# Patient Record
Sex: Female | Born: 1955
Health system: Southern US, Community
[De-identification: ages and names within clinical notes are randomized; demographics above are authoritative.]

## PROBLEM LIST (undated history)

## (undated) DIAGNOSIS — F99 Mental disorder, not otherwise specified: Secondary | ICD-10-CM

## (undated) DIAGNOSIS — L309 Dermatitis, unspecified: Secondary | ICD-10-CM

## (undated) DIAGNOSIS — J45909 Unspecified asthma, uncomplicated: Secondary | ICD-10-CM

## (undated) DIAGNOSIS — N898 Other specified noninflammatory disorders of vagina: Secondary | ICD-10-CM

## (undated) DIAGNOSIS — B379 Candidiasis, unspecified: Secondary | ICD-10-CM

## (undated) DIAGNOSIS — T1490XA Injury, unspecified, initial encounter: Secondary | ICD-10-CM

## (undated) HISTORY — PX: APPENDECTOMY: SHX54

## (undated) HISTORY — PX: KNEE SURGERY: SHX244

## (undated) HISTORY — DX: Other specified noninflammatory disorders of vagina: N89.8

## (undated) HISTORY — DX: Dermatitis, unspecified: L30.9

## (undated) HISTORY — PX: GALLBLADDER SURGERY: SHX652

## (undated) HISTORY — DX: Mental disorder, not otherwise specified: F99

## (undated) HISTORY — PX: ABDOMINAL SURGERY: SHX537

## (undated) HISTORY — PX: FOOT SURGERY: SHX648

## (undated) HISTORY — DX: Unspecified asthma, uncomplicated: J45.909

## (undated) HISTORY — DX: Candidiasis, unspecified: B37.9

## (undated) HISTORY — DX: Injury, unspecified, initial encounter: T14.90XA

---

## 2004-11-23 ENCOUNTER — Ambulatory Visit: Payer: Self-pay | Admitting: Internal Medicine

## 2004-12-02 ENCOUNTER — Ambulatory Visit: Payer: Self-pay | Admitting: Internal Medicine

## 2005-05-27 ENCOUNTER — Emergency Department: Payer: Self-pay | Admitting: Emergency Medicine

## 2006-01-18 ENCOUNTER — Ambulatory Visit: Payer: Self-pay | Admitting: Internal Medicine

## 2006-01-29 ENCOUNTER — Emergency Department: Payer: Self-pay | Admitting: Internal Medicine

## 2006-08-23 ENCOUNTER — Ambulatory Visit: Payer: Self-pay | Admitting: Rheumatology

## 2008-01-28 ENCOUNTER — Ambulatory Visit: Payer: Self-pay | Admitting: Internal Medicine

## 2009-01-29 ENCOUNTER — Ambulatory Visit: Payer: Self-pay | Admitting: Internal Medicine

## 2009-04-07 ENCOUNTER — Ambulatory Visit: Payer: Self-pay | Admitting: Gastroenterology

## 2009-11-27 ENCOUNTER — Ambulatory Visit: Payer: Self-pay | Admitting: Rheumatology

## 2010-03-24 ENCOUNTER — Ambulatory Visit: Payer: Self-pay | Admitting: Internal Medicine

## 2010-07-01 ENCOUNTER — Ambulatory Visit: Payer: Self-pay | Admitting: Orthopedic Surgery

## 2011-04-07 ENCOUNTER — Ambulatory Visit: Payer: Self-pay | Admitting: Internal Medicine

## 2012-05-23 ENCOUNTER — Ambulatory Visit: Payer: Self-pay | Admitting: Internal Medicine

## 2013-05-24 ENCOUNTER — Ambulatory Visit: Payer: Self-pay | Admitting: Internal Medicine

## 2013-12-12 ENCOUNTER — Other Ambulatory Visit: Payer: Self-pay | Admitting: Adult Health

## 2013-12-13 ENCOUNTER — Encounter: Payer: Self-pay | Admitting: *Deleted

## 2013-12-13 ENCOUNTER — Other Ambulatory Visit: Payer: Self-pay | Admitting: Adult Health

## 2013-12-21 DIAGNOSIS — T1490XA Injury, unspecified, initial encounter: Secondary | ICD-10-CM

## 2013-12-21 HISTORY — DX: Injury, unspecified, initial encounter: T14.90XA

## 2014-01-10 ENCOUNTER — Other Ambulatory Visit: Payer: Self-pay | Admitting: Adult Health

## 2014-01-29 ENCOUNTER — Encounter: Payer: Self-pay | Admitting: Adult Health

## 2014-01-29 ENCOUNTER — Other Ambulatory Visit (HOSPITAL_COMMUNITY)
Admission: RE | Admit: 2014-01-29 | Discharge: 2014-01-29 | Disposition: A | Discharge status: Home / Self Care | Payer: Medicare Other | Source: Ambulatory Visit | Attending: Adult Health | Admitting: Adult Health

## 2014-01-29 ENCOUNTER — Ambulatory Visit (INDEPENDENT_AMBULATORY_CARE_PROVIDER_SITE_OTHER): Payer: PRIVATE HEALTH INSURANCE | Admitting: Adult Health

## 2014-01-29 VITALS — BP 120/70 | HR 78 | Ht 65.0 in | Wt 214.0 lb

## 2014-01-29 DIAGNOSIS — Z01419 Encounter for gynecological examination (general) (routine) without abnormal findings: Secondary | ICD-10-CM

## 2014-01-29 DIAGNOSIS — Z124 Encounter for screening for malignant neoplasm of cervix: Secondary | ICD-10-CM | POA: Insufficient documentation

## 2014-01-29 DIAGNOSIS — Z1212 Encounter for screening for malignant neoplasm of rectum: Secondary | ICD-10-CM

## 2014-01-29 DIAGNOSIS — Z1151 Encounter for screening for human papillomavirus (HPV): Secondary | ICD-10-CM | POA: Insufficient documentation

## 2014-01-29 LAB — HEMOCCULT GUIAC POC 1CARD (OFFICE): FECAL OCCULT BLD: NEGATIVE
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
## 2014-01-29 NOTE — Patient Instructions (Signed)
Physical in 1 year Mammogram yearly colonoscopy as per GI TRY LUVENA for vaginal moisture Try ASTRO GLIDE for sex Labs with Dr Dareen PianoAnderson in [place]

## 2014-02-03 ENCOUNTER — Telehealth: Payer: Self-pay | Admitting: *Deleted

## 2014-02-03 NOTE — Telephone Encounter (Signed)
Pt informed of normal pap results from 01/29/2014.

## 2014-07-03 ENCOUNTER — Ambulatory Visit: Payer: Self-pay | Admitting: Internal Medicine

## 2014-07-28 ENCOUNTER — Encounter: Payer: Self-pay | Admitting: Adult Health

## 2014-11-07 ENCOUNTER — Ambulatory Visit: Payer: Self-pay | Admitting: Gastroenterology

## 2015-02-02 ENCOUNTER — Ambulatory Visit (INDEPENDENT_AMBULATORY_CARE_PROVIDER_SITE_OTHER): Payer: Medicare Other | Admitting: Adult Health

## 2015-02-02 ENCOUNTER — Encounter: Payer: Self-pay | Admitting: Adult Health

## 2015-02-02 VITALS — BP 120/70 | HR 68 | Ht 65.25 in | Wt 211.0 lb

## 2015-02-02 DIAGNOSIS — Z0000 Encounter for general adult medical examination without abnormal findings: Secondary | ICD-10-CM

## 2015-02-02 DIAGNOSIS — N898 Other specified noninflammatory disorders of vagina: Secondary | ICD-10-CM | POA: Insufficient documentation

## 2015-02-02 DIAGNOSIS — Z01419 Encounter for gynecological examination (general) (routine) without abnormal findings: Secondary | ICD-10-CM

## 2015-02-02 DIAGNOSIS — B379 Candidiasis, unspecified: Secondary | ICD-10-CM

## 2015-02-02 DIAGNOSIS — Z1212 Encounter for screening for malignant neoplasm of rectum: Secondary | ICD-10-CM | POA: Diagnosis not present

## 2015-02-02 HISTORY — DX: Other specified noninflammatory disorders of vagina: N89.8

## 2015-02-02 HISTORY — DX: Candidiasis, unspecified: B37.9

## 2015-02-02 LAB — POCT WET PREP (WET MOUNT): WBC, Wet Prep HPF POC: POSITIVE

## 2015-02-02 LAB — HEMOCCULT GUIAC POC 1CARD (OFFICE): Fecal Occult Blood, POC: NEGATIVE

## 2015-02-02 MED ORDER — FLUCONAZOLE 150 MG PO TABS: ORAL_TABLET | ORAL | Status: DC

## 2015-02-02 NOTE — Progress Notes (Signed)
Patient ID: Belinda Porter, female   DOB: [birth year], 59 y.o.   MRN: [id number] History of Present Illness: Belinda Porter is a 59 year old black female in for well woman gyn exam.She had a normal pap with negative HPV 01/29/14.She complains of vaginal discharge ?yeast.   Current Medications, Allergies, Past Medical History, Past Surgical History, Family History and Social History were reviewed in [facility] electronic medical record.     Review of Systems: Patient denies any headaches, hearing loss, fatigue, blurred vision, shortness of breath, chest pain, abdominal pain, problems with bowel movements, urination, or intercourse. No joint pain or mood swings.    Physical Exam:BP 120/70 mmHg  Pulse 68  Ht 5' 5.25" (1.657 m)  Wt 211 lb (95.709 kg)  BMI 34.86 kg/m2 General:  Well developed, well nourished, no acute distress Skin:  Warm and dry Neck:  Midline trachea, normal thyroid, good ROM, no lymphadenopathy Lungs; Clear to auscultation bilaterally Breast:  No dominant palpable mass, retraction, or nipple discharge Cardiovascular: Regular rate and rhythm Abdomen:  Soft, non tender, no hepatosplenomegaly Pelvic:  External genitalia is normal in appearance, no lesions.  The vagina has whote clumpy discharge, wet prep + yeast. Urethra has no lesions or masses. The cervix is bulbous.  Uterus is felt to be normal size, shape, and contour.  No adnexal masses or tenderness noted.Bladder is non tender, no masses felt. Rectal: Good sphincter tone, no polyps, or hemorrhoids felt.  Hemoccult negative. Extremities/musculoskeletal:  No swelling or varicosities noted, no clubbing or cyanosis Psych:  No mood changes, alert and cooperative,seems happy Had colonoscopy this year.  Impression: Well woman gyn exam no pap Vaginal discharge Yeast infection    Plan: Physical in 1 year Mammogram yearly Colonoscopy in 10 year Check CBC,CMP,TSH and lipids Rx diflucan 150 mg #2 take 1 now and 1 in 3 days if  needed with 1 refill Try luvena and astroglide

## 2015-02-02 NOTE — Patient Instructions (Addendum)
Physical in 1 year Mammogram yearly  Colonoscopy in 10 years Try luvena and astroglide

## 2015-02-03 LAB — COMPREHENSIVE METABOLIC PANEL
ALBUMIN: 4.1 g/dL (ref 3.5–5.5)
ALT: 18 IU/L (ref 0–32)
AST: 13 IU/L (ref 0–40)
Albumin/Globulin Ratio: 1.2 (ref 1.1–2.5)
Alkaline Phosphatase: 98 IU/L (ref 39–117)
BILIRUBIN TOTAL: 0.3 mg/dL (ref 0.0–1.2)
BUN/Creatinine Ratio: 12 (ref 9–23)
BUN: 11 mg/dL (ref 6–24)
CHLORIDE: 100 mmol/L (ref 97–108)
CO2: 24 mmol/L (ref 18–29)
Calcium: 9.5 mg/dL (ref 8.7–10.2)
Creatinine, Ser: 0.95 mg/dL (ref 0.57–1.00)
GFR calc non Af Amer: 67 mL/min/{1.73_m2} (ref >59)
GFR, EST AFRICAN AMERICAN: 77 mL/min/{1.73_m2} (ref >59)
Globulin, Total: 3.4 g/dL (ref 1.5–4.5)
Glucose: 101 mg/dL — ABNORMAL HIGH (ref 65–99)
Potassium: 4.5 mmol/L (ref 3.5–5.2)
Sodium: 140 mmol/L (ref 134–144)
Total Protein: 7.5 g/dL (ref 6.0–8.5)

## 2015-02-03 LAB — CBC
Hematocrit: 37.7 % (ref 34.0–46.6)
Hemoglobin: 12.7 g/dL (ref 11.1–15.9)
MCH: 27.1 pg (ref 26.6–33.0)
MCHC: 33.7 g/dL (ref 31.5–35.7)
MCV: 80 fL (ref 79–97)
Platelets: 311 10*3/uL (ref 150–379)
RBC: 4.69 x10E6/uL (ref 3.77–5.28)
RDW: 14.3 % (ref 12.3–15.4)
WBC: 6.4 10*3/uL (ref 3.4–10.8)

## 2015-02-03 LAB — LIPID PANEL
Chol/HDL Ratio: 5 ratio units — ABNORMAL HIGH (ref 0.0–4.4)
Cholesterol, Total: 160 mg/dL (ref 100–199)
HDL: 32 mg/dL — AB (ref >39)
LDL Calculated: 114 mg/dL — ABNORMAL HIGH (ref 0–99)
Triglycerides: 72 mg/dL (ref 0–149)
VLDL Cholesterol Cal: 14 mg/dL (ref 5–40)

## 2015-02-03 LAB — TSH: TSH: 1.27 u[IU]/mL (ref 0.450–4.500)

## 2015-02-09 ENCOUNTER — Telehealth: Payer: Self-pay | Admitting: Adult Health

## 2015-02-09 NOTE — Telephone Encounter (Signed)
Pt aware of labs and need to increase exercise and take krell oil to increase HDL

## 2015-02-09 NOTE — Telephone Encounter (Signed)
Left message to call about labs 

## 2015-04-12 ENCOUNTER — Emergency Department
Admission: EM | Admit: 2015-04-12 | Discharge: 2015-04-12 | Disposition: A | Discharge status: Home / Self Care | Payer: Medicare Other | Attending: Emergency Medicine | Admitting: Emergency Medicine

## 2015-04-12 ENCOUNTER — Encounter: Payer: Self-pay | Admitting: Emergency Medicine

## 2015-04-12 DIAGNOSIS — Z79899 Other long term (current) drug therapy: Secondary | ICD-10-CM | POA: Insufficient documentation

## 2015-04-12 DIAGNOSIS — R109 Unspecified abdominal pain: Secondary | ICD-10-CM | POA: Diagnosis present

## 2015-04-12 DIAGNOSIS — Z7952 Long term (current) use of systemic steroids: Secondary | ICD-10-CM | POA: Insufficient documentation

## 2015-04-12 DIAGNOSIS — R1031 Right lower quadrant pain: Secondary | ICD-10-CM | POA: Diagnosis not present

## 2015-04-12 DIAGNOSIS — Z7951 Long term (current) use of inhaled steroids: Secondary | ICD-10-CM | POA: Insufficient documentation

## 2015-04-12 LAB — URINALYSIS COMPLETE WITH MICROSCOPIC (ARMC ONLY)
BILIRUBIN URINE: NEGATIVE
Bacteria, UA: NONE SEEN
Glucose, UA: NEGATIVE mg/dL
Hgb urine dipstick: NEGATIVE
KETONES UR: NEGATIVE mg/dL
Leukocytes, UA: NEGATIVE
Nitrite: NEGATIVE
Protein, ur: NEGATIVE mg/dL
SPECIFIC GRAVITY, URINE: 1.005 (ref 1.005–1.030)
SQUAMOUS EPITHELIAL / LPF: NONE SEEN
pH: 6 (ref 5.0–8.0)

## 2015-04-12 LAB — COMPREHENSIVE METABOLIC PANEL
ALK PHOS: 88 U/L (ref 38–126)
ALT: 21 U/L (ref 14–54)
AST: 20 U/L (ref 15–41)
Albumin: 3.7 g/dL (ref 3.5–5.0)
Anion gap: 6 (ref 5–15)
BUN: 11 mg/dL (ref 6–20)
CHLORIDE: 103 mmol/L (ref 101–111)
CO2: 27 mmol/L (ref 22–32)
Calcium: 8.5 mg/dL — ABNORMAL LOW (ref 8.9–10.3)
Creatinine, Ser: 0.88 mg/dL (ref 0.44–1.00)
GLUCOSE: 107 mg/dL — AB (ref 65–99)
POTASSIUM: 3.7 mmol/L (ref 3.5–5.1)
Sodium: 136 mmol/L (ref 135–145)
Total Bilirubin: 0.5 mg/dL (ref 0.3–1.2)
Total Protein: 7.6 g/dL (ref 6.5–8.1)

## 2015-04-12 LAB — CBC WITH DIFFERENTIAL/PLATELET
BASOS ABS: 0.2 10*3/uL — AB (ref 0–0.1)
BASOS PCT: 3 %
Eosinophils Absolute: 0.1 10*3/uL (ref 0–0.7)
Eosinophils Relative: 1 %
HEMATOCRIT: 37.2 % (ref 35.0–47.0)
Hemoglobin: 12.2 g/dL (ref 12.0–16.0)
LYMPHS PCT: 24 %
Lymphs Abs: 1.6 10*3/uL (ref 1.0–3.6)
MCH: 27.4 pg (ref 26.0–34.0)
MCHC: 32.9 g/dL (ref 32.0–36.0)
MCV: 83.5 fL (ref 80.0–100.0)
Monocytes Absolute: 0.7 10*3/uL (ref 0.2–0.9)
Monocytes Relative: 10 %
NEUTROS ABS: 4.2 10*3/uL (ref 1.4–6.5)
NEUTROS PCT: 62 %
Platelets: 270 10*3/uL (ref 150–440)
RBC: 4.46 MIL/uL (ref 3.80–5.20)
RDW: 13.7 % (ref 11.5–14.5)
WBC: 6.7 10*3/uL (ref 3.6–11.0)

## 2015-04-12 LAB — LIPASE, BLOOD: LIPASE: 14 U/L — AB (ref 22–51)

## 2015-04-12 MED ORDER — ONDANSETRON HCL 4 MG PO TABS: 4.0000 mg | ORAL_TABLET | Freq: Every day | ORAL | Status: AC | PRN

## 2015-04-12 MED ORDER — DICYCLOMINE HCL 20 MG PO TABS: 20.0000 mg | ORAL_TABLET | Freq: Three times a day (TID) | ORAL | Status: AC | PRN

## 2015-04-12 MED ORDER — SODIUM CHLORIDE 0.9 % IV BOLUS (SEPSIS)
1000.0000 mL | Freq: Once | INTRAVENOUS | Status: AC
  Administered 2015-04-12: 1000 mL via INTRAVENOUS

## 2015-04-12 MED ORDER — DICYCLOMINE HCL 10 MG PO CAPS
10.0000 mg | ORAL_CAPSULE | Freq: Once | ORAL | Status: AC
  Administered 2015-04-12: 10 mg via ORAL
  Filled 2015-04-12: qty 1

## 2015-04-12 MED ORDER — ONDANSETRON HCL 4 MG/2ML IJ SOLN
4.0000 mg | Freq: Once | INTRAMUSCULAR | Status: AC
  Administered 2015-04-12: 4 mg via INTRAVENOUS
  Filled 2015-04-12: qty 2

## 2015-04-12 NOTE — Discharge Instructions (Signed)
Please seek medical attention for any high fevers, chest pain, shortness of breath, change in behavior, persistent vomiting, bloody stool or any other new or concerning symptoms. ° °Abdominal Pain °Many things can cause abdominal pain. Usually, abdominal pain is not caused by a disease and will improve without treatment. It can often be observed and treated at home. Your health care provider will do a physical exam and possibly order blood tests and X-rays to help determine the seriousness of your pain. However, in many cases, more time must pass before a clear cause of the pain can be found. Before that point, your health care provider may not know if you need more testing or further treatment. °HOME CARE INSTRUCTIONS  °Monitor your abdominal pain for any changes. The following actions may help to alleviate any discomfort you are experiencing: °· Only take over-the-counter or prescription medicines as directed by your health care provider. °· Do not take laxatives unless directed to do so by your health care provider. °· Try a clear liquid diet (broth, tea, or water) as directed by your health care provider. Slowly move to a bland diet as tolerated. °SEEK MEDICAL CARE IF: °· You have unexplained abdominal pain. °· You have abdominal pain associated with nausea or diarrhea. °· You have pain when you urinate or have a bowel movement. °· You experience abdominal pain that wakes you in the night. °· You have abdominal pain that is worsened or improved by eating food. °· You have abdominal pain that is worsened with eating fatty foods. °· You have a fever. °SEEK IMMEDIATE MEDICAL CARE IF:  °· Your pain does not go away within 2 hours. °· You keep throwing up (vomiting). °· Your pain is felt only in portions of the abdomen, such as the right side or the left lower portion of the abdomen. °· You pass bloody or black tarry stools. °MAKE SURE YOU: °· Understand these instructions.   °· Will watch your condition.   °· Will  get help right away if you are not doing well or get worse.   °Document Released: 06/22/2005 Document Revised: 09/17/2013 Document Reviewed: 05/22/2013 °ExitCare® Patient Information ©2015 [employer]. This information is not intended to replace advice given to you by your health care provider. Make sure you discuss any questions you have with your health care provider. ° °
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
## 2015-04-12 NOTE — ED Notes (Signed)
Patient arrives from home with lower right abd pain, difficulty with bowel movents, nausea, abd distention, belching. Patient has recent diagnosis of diverticulitis s/p colonoscopy.

## 2015-08-10 ENCOUNTER — Encounter: Payer: Self-pay | Admitting: Podiatry

## 2015-08-10 ENCOUNTER — Ambulatory Visit (INDEPENDENT_AMBULATORY_CARE_PROVIDER_SITE_OTHER): Payer: Medicare Other | Admitting: Podiatry

## 2015-08-10 ENCOUNTER — Ambulatory Visit (INDEPENDENT_AMBULATORY_CARE_PROVIDER_SITE_OTHER): Payer: Medicare Other

## 2015-08-10 VITALS — BP 113/70 | HR 69 | Resp 12

## 2015-08-10 DIAGNOSIS — R52 Pain, unspecified: Secondary | ICD-10-CM

## 2015-08-10 DIAGNOSIS — Q6650 Congenital pes planus, unspecified foot: Secondary | ICD-10-CM

## 2015-08-10 DIAGNOSIS — M722 Plantar fascial fibromatosis: Secondary | ICD-10-CM | POA: Diagnosis not present

## 2015-08-10 MED ORDER — MELOXICAM 15 MG PO TABS: 15.0000 mg | ORAL_TABLET | Freq: Every day | ORAL | Status: DC

## 2015-08-10 MED ORDER — METHYLPREDNISOLONE 4 MG PO TBPK: ORAL_TABLET | ORAL | Status: DC

## 2015-08-10 NOTE — Progress Notes (Signed)
   Subjective:    Patient ID: Belinda Porter, female    DOB: Dec 28, 1955, 59 y.o.   MRN: [id number]  HPI: She presents today with one year history of pain to the bilateral foot. She states that the outside of the feet hurt when she walks in the dorsal lateral aspect hurts. She states that she gets burning and tingling in the feet.    Review of Systems  Musculoskeletal: Positive for joint swelling.       Objective:   Physical Exam: 59 year old female presents today vital signs stable alert and oriented 3 in no apparent distress. Pulses are strongly palpable bilateral. Neurologic sensorium is intact per Semmes-Weinstein monofilament. Deep tendon reflexes are intact bilaterally muscle strength +5 over 5 dorsiflexion plantar flexors and inverters everters all intrinsic musculature is intact. Orthopedic evaluation demonstrates all joints distal to the ankle range of motion without crepitation. She has moderate to severe pain on palpation medial calcaneal tubercles bilateral. Radiographs taken today do demonstrate soft tissue increase in density at the plantar fascial calcaneal insertion site. Cutaneous evaluation of a straight supple well-hydrated cutis no open lesions or wounds.          Assessment & Plan:  Assessment: Plantar fasciitis with lateral compensatory syndrome  Plan: Discussed etiology pathology conservative versus surgical therapies. I injected her bilateral heels today with Kenalog and local anesthetic dispensed plantar fascial braces bilateral. Discussed appropriate shoe gear stretching exercises and ice therapy. Start her on a Medrol Dosepak to be followed by meloxicam. I will follow-up with her in 1 month.

## 2015-08-12 ENCOUNTER — Other Ambulatory Visit: Payer: Self-pay | Admitting: Internal Medicine

## 2015-08-12 DIAGNOSIS — Z1231 Encounter for screening mammogram for malignant neoplasm of breast: Secondary | ICD-10-CM

## 2015-08-19 ENCOUNTER — Ambulatory Visit: Payer: Medicare Other | Attending: Internal Medicine

## 2015-08-26 ENCOUNTER — Other Ambulatory Visit: Payer: Self-pay | Admitting: Internal Medicine

## 2015-08-26 ENCOUNTER — Ambulatory Visit
Admission: RE | Admit: 2015-08-26 | Discharge: 2015-08-26 | Disposition: A | Discharge status: Home / Self Care | Payer: Medicare Other | Source: Ambulatory Visit | Attending: Internal Medicine | Admitting: Internal Medicine

## 2015-08-26 DIAGNOSIS — Z1231 Encounter for screening mammogram for malignant neoplasm of breast: Secondary | ICD-10-CM | POA: Diagnosis present

## 2015-09-07 ENCOUNTER — Encounter: Payer: Self-pay | Admitting: Podiatry

## 2015-09-07 ENCOUNTER — Ambulatory Visit (INDEPENDENT_AMBULATORY_CARE_PROVIDER_SITE_OTHER): Payer: Medicare Other | Admitting: Podiatry

## 2015-09-07 VITALS — BP 112/66 | HR 70 | Resp 12

## 2015-09-07 DIAGNOSIS — M722 Plantar fascial fibromatosis: Secondary | ICD-10-CM | POA: Diagnosis not present

## 2015-09-07 NOTE — Progress Notes (Signed)
She presents today for follow-up of her plantar fasciitis of her right foot. She states that is doing better but is still painful.  Objective: Vital signs are stable she is alert and oriented 3. Pulses are strongly palpable. Neurologic sensorium is intact. Deep tendon reflexes are intact. She has pain on palpation medial calcaneal tubercle of the right heel.  Assessment: Pain in limb secondary to onychomycosis.  Plan: Reinjected the right heel today with Kenalog and local anesthetic continue all conservative therapies such as plantar fascial brace night splint and nonsteroidals.

## 2015-10-12 ENCOUNTER — Ambulatory Visit: Payer: Medicare Other | Admitting: Podiatry

## 2015-10-12 ENCOUNTER — Encounter: Payer: Self-pay | Admitting: Podiatry

## 2015-10-12 ENCOUNTER — Ambulatory Visit (INDEPENDENT_AMBULATORY_CARE_PROVIDER_SITE_OTHER): Payer: Medicare Other | Admitting: Podiatry

## 2015-10-12 VITALS — BP 104/79 | HR 80 | Resp 16

## 2015-10-12 DIAGNOSIS — M722 Plantar fascial fibromatosis: Secondary | ICD-10-CM

## 2015-10-12 NOTE — Progress Notes (Signed)
She presents today for follow-up of her plantar fasciitis bilateral. She states that she is doing much better.  Objective: Vital signs are stable she is alert and oriented 3. Pulses are palpable. No open lesions or wounds. Muscle strength is normal bilateral. She has much decrease in pain on palpation medial calcaneal tubercles bilateral.  Assessment: Resolving plantar fasciitis bilateral.  Plan: Continue all conservative therapies until one month past 100% healed. Follow-up with her at that time.

## 2016-01-12 DIAGNOSIS — J45909 Unspecified asthma, uncomplicated: Secondary | ICD-10-CM | POA: Diagnosis not present

## 2016-01-12 DIAGNOSIS — E8881 Metabolic syndrome: Secondary | ICD-10-CM | POA: Diagnosis not present

## 2016-01-12 DIAGNOSIS — J302 Other seasonal allergic rhinitis: Secondary | ICD-10-CM | POA: Diagnosis not present

## 2016-01-12 DIAGNOSIS — Z789 Other specified health status: Secondary | ICD-10-CM | POA: Diagnosis not present

## 2016-01-14 DIAGNOSIS — I10 Essential (primary) hypertension: Secondary | ICD-10-CM | POA: Diagnosis not present

## 2016-01-14 DIAGNOSIS — E784 Other hyperlipidemia: Secondary | ICD-10-CM | POA: Diagnosis not present

## 2016-01-14 DIAGNOSIS — R5381 Other malaise: Secondary | ICD-10-CM | POA: Diagnosis not present

## 2016-01-15 ENCOUNTER — Ambulatory Visit: Payer: Medicare Other | Admitting: Dietician

## 2016-01-19 DIAGNOSIS — E889 Metabolic disorder, unspecified: Secondary | ICD-10-CM | POA: Diagnosis not present

## 2016-01-19 DIAGNOSIS — E639 Nutritional deficiency, unspecified: Secondary | ICD-10-CM | POA: Diagnosis not present

## 2016-01-19 DIAGNOSIS — J45909 Unspecified asthma, uncomplicated: Secondary | ICD-10-CM | POA: Diagnosis not present

## 2016-01-29 DIAGNOSIS — H109 Unspecified conjunctivitis: Secondary | ICD-10-CM | POA: Diagnosis not present

## 2016-01-29 DIAGNOSIS — Z789 Other specified health status: Secondary | ICD-10-CM | POA: Diagnosis not present

## 2016-01-29 DIAGNOSIS — H1030 Unspecified acute conjunctivitis, unspecified eye: Secondary | ICD-10-CM | POA: Diagnosis not present

## 2016-01-29 DIAGNOSIS — J302 Other seasonal allergic rhinitis: Secondary | ICD-10-CM | POA: Diagnosis not present

## 2016-03-04 DIAGNOSIS — M1711 Unilateral primary osteoarthritis, right knee: Secondary | ICD-10-CM | POA: Diagnosis not present

## 2016-03-21 DIAGNOSIS — J45909 Unspecified asthma, uncomplicated: Secondary | ICD-10-CM | POA: Diagnosis not present

## 2016-03-21 DIAGNOSIS — E639 Nutritional deficiency, unspecified: Secondary | ICD-10-CM | POA: Diagnosis not present

## 2016-03-21 DIAGNOSIS — J302 Other seasonal allergic rhinitis: Secondary | ICD-10-CM | POA: Diagnosis not present

## 2016-03-21 DIAGNOSIS — E889 Metabolic disorder, unspecified: Secondary | ICD-10-CM | POA: Diagnosis not present

## 2016-04-11 DIAGNOSIS — F4310 Post-traumatic stress disorder, unspecified: Secondary | ICD-10-CM | POA: Diagnosis not present

## 2016-04-11 DIAGNOSIS — Z79899 Other long term (current) drug therapy: Secondary | ICD-10-CM | POA: Diagnosis not present

## 2016-04-11 DIAGNOSIS — F330 Major depressive disorder, recurrent, mild: Secondary | ICD-10-CM | POA: Diagnosis not present

## 2016-04-21 ENCOUNTER — Other Ambulatory Visit: Payer: Medicare Other | Admitting: Adult Health

## 2016-04-22 ENCOUNTER — Encounter: Payer: Self-pay | Admitting: Adult Health

## 2016-04-22 ENCOUNTER — Ambulatory Visit (INDEPENDENT_AMBULATORY_CARE_PROVIDER_SITE_OTHER): Payer: Medicare Other | Admitting: Adult Health

## 2016-04-22 VITALS — BP 110/66 | HR 74 | Ht 65.0 in | Wt 204.0 lb

## 2016-04-22 DIAGNOSIS — Z1211 Encounter for screening for malignant neoplasm of colon: Secondary | ICD-10-CM | POA: Diagnosis not present

## 2016-04-22 DIAGNOSIS — Z01419 Encounter for gynecological examination (general) (routine) without abnormal findings: Secondary | ICD-10-CM | POA: Diagnosis not present

## 2016-04-22 LAB — HEMOCCULT GUIAC POC 1CARD (OFFICE): Fecal Occult Blood, POC: NEGATIVE

## 2016-04-22 NOTE — Patient Instructions (Signed)
Pap and physical in 1 year Mammogram yearly Labs with PCP 

## 2016-04-22 NOTE — Progress Notes (Signed)
Patient ID: Belinda Porter, female   DOB: [birth year], 58 y.o.   MRN: [id number] History of Present Illness: Belinda Porter is a 60 year old black female in for a well woman gyn exam,she had a normal pap with negative HPV 01/29/14. PCP is Dr Belinda Porter in [place].   Current Medications, Allergies, Past Medical History, Past Surgical History, Family History and Social History were reviewed in [facility] record.     Review of Systems: Patient denies any headaches, hearing loss, fatigue, blurred vision, shortness of breath, chest pain, abdominal pain, problems with bowel movements, urination, or intercourse. No joint pain or mood swings.    Physical Exam:BP 110/66 (BP Location: Left Arm, Patient Position: Sitting, Cuff Size: Large)   Pulse 74   Ht 5\' 5"  (1.651 m)   Wt 204 lb (92.5 kg)   BMI 33.95 kg/m  General:  Well developed, well nourished, no acute distress Skin:  Warm and dry Neck:  Midline trachea, normal thyroid, good ROM, no lymphadenopathy Lungs; Clear to auscultation bilaterally Breast:  No dominant palpable mass, retraction, or nipple discharge Cardiovascular: Regular rate and rhythm Abdomen:  Soft, non tender, no hepatosplenomegaly Pelvic:  External genitalia is normal in appearance, no lesions.  The vagina is normal in appearance. Urethra has no lesions or masses. The cervix is bulbous.  Uterus is felt to be normal size, shape, and contour.  No adnexal masses or tenderness noted.Bladder is non tender, no masses felt. Rectal: Good sphincter tone, no polyps, or hemorrhoids felt.  Hemoccult negative. Extremities/musculoskeletal:  No swelling or varicosities noted, no clubbing or cyanosis Psych:  No mood changes, alert and cooperative,seems happy   Impression: Well woman gyn exam no pap    Plan: Pap and physical in 1 year Mammogram yearly Labs with PCP

## 2016-05-25 DIAGNOSIS — M1611 Unilateral primary osteoarthritis, right hip: Secondary | ICD-10-CM | POA: Diagnosis not present

## 2016-06-21 DIAGNOSIS — M25561 Pain in right knee: Secondary | ICD-10-CM | POA: Diagnosis not present

## 2016-06-21 DIAGNOSIS — R55 Syncope and collapse: Secondary | ICD-10-CM | POA: Diagnosis not present

## 2016-06-21 DIAGNOSIS — E889 Metabolic disorder, unspecified: Secondary | ICD-10-CM | POA: Diagnosis not present

## 2016-06-21 DIAGNOSIS — M1711 Unilateral primary osteoarthritis, right knee: Secondary | ICD-10-CM | POA: Diagnosis not present

## 2016-06-22 ENCOUNTER — Ambulatory Visit
Admission: RE | Admit: 2016-06-22 | Discharge: 2016-06-22 | Disposition: A | Discharge status: Home / Self Care | Payer: Medicare Other | Source: Ambulatory Visit | Attending: Internal Medicine | Admitting: Internal Medicine

## 2016-06-22 ENCOUNTER — Other Ambulatory Visit: Payer: Self-pay | Admitting: Internal Medicine

## 2016-06-22 DIAGNOSIS — M25551 Pain in right hip: Secondary | ICD-10-CM | POA: Diagnosis not present

## 2016-06-22 DIAGNOSIS — M160 Bilateral primary osteoarthritis of hip: Secondary | ICD-10-CM | POA: Diagnosis not present

## 2016-06-22 DIAGNOSIS — M25511 Pain in right shoulder: Secondary | ICD-10-CM | POA: Insufficient documentation

## 2016-06-22 DIAGNOSIS — R52 Pain, unspecified: Secondary | ICD-10-CM

## 2016-06-22 DIAGNOSIS — S79911A Unspecified injury of right hip, initial encounter: Secondary | ICD-10-CM | POA: Diagnosis not present

## 2016-07-06 DIAGNOSIS — M76891 Other specified enthesopathies of right lower limb, excluding foot: Secondary | ICD-10-CM | POA: Diagnosis not present

## 2016-07-06 DIAGNOSIS — J45909 Unspecified asthma, uncomplicated: Secondary | ICD-10-CM | POA: Diagnosis not present

## 2016-07-06 DIAGNOSIS — J302 Other seasonal allergic rhinitis: Secondary | ICD-10-CM | POA: Diagnosis not present

## 2016-07-06 DIAGNOSIS — R55 Syncope and collapse: Secondary | ICD-10-CM | POA: Diagnosis not present

## 2016-07-11 DIAGNOSIS — J302 Other seasonal allergic rhinitis: Secondary | ICD-10-CM | POA: Diagnosis not present

## 2016-07-11 DIAGNOSIS — E639 Nutritional deficiency, unspecified: Secondary | ICD-10-CM | POA: Diagnosis not present

## 2016-07-11 DIAGNOSIS — R079 Chest pain, unspecified: Secondary | ICD-10-CM | POA: Diagnosis not present

## 2016-07-11 DIAGNOSIS — E889 Metabolic disorder, unspecified: Secondary | ICD-10-CM | POA: Diagnosis not present

## 2016-07-12 DIAGNOSIS — R079 Chest pain, unspecified: Secondary | ICD-10-CM | POA: Diagnosis not present

## 2016-07-12 DIAGNOSIS — J302 Other seasonal allergic rhinitis: Secondary | ICD-10-CM | POA: Diagnosis not present

## 2016-07-12 DIAGNOSIS — E889 Metabolic disorder, unspecified: Secondary | ICD-10-CM | POA: Diagnosis not present

## 2016-07-12 DIAGNOSIS — R55 Syncope and collapse: Secondary | ICD-10-CM | POA: Diagnosis not present

## 2016-07-22 ENCOUNTER — Other Ambulatory Visit: Payer: Self-pay | Admitting: Internal Medicine

## 2016-07-22 DIAGNOSIS — Z1231 Encounter for screening mammogram for malignant neoplasm of breast: Secondary | ICD-10-CM

## 2016-08-26 ENCOUNTER — Ambulatory Visit
Admission: RE | Admit: 2016-08-26 | Discharge: 2016-08-26 | Disposition: A | Discharge status: Home / Self Care | Payer: Medicare Other | Source: Ambulatory Visit | Attending: Internal Medicine | Admitting: Internal Medicine

## 2016-08-26 DIAGNOSIS — Z1231 Encounter for screening mammogram for malignant neoplasm of breast: Secondary | ICD-10-CM | POA: Diagnosis not present

## 2016-10-28 DIAGNOSIS — H109 Unspecified conjunctivitis: Secondary | ICD-10-CM | POA: Diagnosis not present

## 2016-10-28 DIAGNOSIS — S43421S Sprain of right rotator cuff capsule, sequela: Secondary | ICD-10-CM | POA: Diagnosis not present

## 2016-10-28 DIAGNOSIS — J45909 Unspecified asthma, uncomplicated: Secondary | ICD-10-CM | POA: Diagnosis not present

## 2016-12-30 DIAGNOSIS — M1711 Unilateral primary osteoarthritis, right knee: Secondary | ICD-10-CM | POA: Diagnosis not present

## 2016-12-30 DIAGNOSIS — M1611 Unilateral primary osteoarthritis, right hip: Secondary | ICD-10-CM | POA: Diagnosis not present

## 2017-02-06 DIAGNOSIS — H109 Unspecified conjunctivitis: Secondary | ICD-10-CM | POA: Diagnosis not present

## 2017-02-06 DIAGNOSIS — J45909 Unspecified asthma, uncomplicated: Secondary | ICD-10-CM | POA: Diagnosis not present

## 2017-02-06 DIAGNOSIS — Z789 Other specified health status: Secondary | ICD-10-CM | POA: Diagnosis not present

## 2017-02-07 DIAGNOSIS — S0501XA Injury of conjunctiva and corneal abrasion without foreign body, right eye, initial encounter: Secondary | ICD-10-CM | POA: Diagnosis not present

## 2017-02-08 DIAGNOSIS — S0502XD Injury of conjunctiva and corneal abrasion without foreign body, left eye, subsequent encounter: Secondary | ICD-10-CM | POA: Diagnosis not present

## 2017-02-13 DIAGNOSIS — S0502XD Injury of conjunctiva and corneal abrasion without foreign body, left eye, subsequent encounter: Secondary | ICD-10-CM | POA: Diagnosis not present

## 2017-05-01 ENCOUNTER — Ambulatory Visit (INDEPENDENT_AMBULATORY_CARE_PROVIDER_SITE_OTHER): Payer: Medicare Other | Admitting: Adult Health

## 2017-05-01 ENCOUNTER — Encounter: Payer: Self-pay | Admitting: Adult Health

## 2017-05-01 ENCOUNTER — Encounter (INDEPENDENT_AMBULATORY_CARE_PROVIDER_SITE_OTHER): Payer: Self-pay

## 2017-05-01 ENCOUNTER — Other Ambulatory Visit (HOSPITAL_COMMUNITY)
Admission: RE | Admit: 2017-05-01 | Discharge: 2017-05-01 | Disposition: A | Discharge status: Home / Self Care | Payer: Medicare Other | Source: Ambulatory Visit | Attending: Adult Health | Admitting: Adult Health

## 2017-05-01 VITALS — BP 130/78 | HR 80 | Ht 65.0 in | Wt 216.0 lb

## 2017-05-01 DIAGNOSIS — Z1212 Encounter for screening for malignant neoplasm of rectum: Secondary | ICD-10-CM | POA: Diagnosis not present

## 2017-05-01 DIAGNOSIS — Z01419 Encounter for gynecological examination (general) (routine) without abnormal findings: Secondary | ICD-10-CM | POA: Insufficient documentation

## 2017-05-01 DIAGNOSIS — Z1211 Encounter for screening for malignant neoplasm of colon: Secondary | ICD-10-CM | POA: Diagnosis not present

## 2017-05-01 DIAGNOSIS — Z124 Encounter for screening for malignant neoplasm of cervix: Secondary | ICD-10-CM

## 2017-05-01 LAB — HEMOCCULT GUIAC POC 1CARD (OFFICE): Fecal Occult Blood, POC: NEGATIVE

## 2017-05-01 NOTE — Progress Notes (Signed)
Patient ID: Belinda Porter, female   DOB: [birth year], 61 y.o.   MRN: [id number] History of Present Illness: Belinda Porter is a 61 year old black female, married, in for well woman gyn exam and pap. PCP is Dr Juel BurrowMasoud.   Current Medications, Allergies, Past Medical History, Past Surgical History, Family History and Social History were reviewed in [facility] electronic medical record.     Review of Systems: Patient denies any headaches, hearing loss, fatigue, blurred vision, shortness of breath, chest pain, abdominal pain, problems with bowel movements, urination, or intercourse. No joint pain or mood swings.    Physical Exam:BP 130/78 (BP Location: Left Arm, Patient Position: Sitting, Cuff Size: Normal)   Pulse 80   Ht 5\' 5"  (1.651 m)   Wt 216 lb (98 kg)   BMI 35.94 kg/m  General:  Well developed, well nourished, no acute distress Skin:  Warm and dry Neck:  Midline trachea, normal thyroid, good ROM, no lymphadenopathy Lungs; Clear to auscultation bilaterally Breast:  No dominant palpable mass, retraction, or nipple discharge Cardiovascular: Regular rate and rhythm Abdomen:  Soft, non tender, no hepatosplenomegaly Pelvic:  External genitalia is normal in appearance, no lesions.  The vagina is normal in appearance. Urethra has no lesions or masses. The cervix is bulbous.Pap with HPV performed.  Uterus is felt to be normal size, shape, and contour.  No adnexal masses or tenderness noted.Bladder is non tender, no masses felt. Rectal: Good sphincter tone, no polyps, or hemorrhoids felt.  Hemoccult negative. Extremities/musculoskeletal:  No swelling or varicosities noted, no clubbing or cyanosis Psych:  No mood changes, alert and cooperative,seems happy PHQ 2 score 1.   Impression: 1. Well woman exam with routine gynecological exam   2. Screening for colorectal cancer       Plan: Physical in 1 year Pap in 3 if normal Mammogram yearly Colonoscopy per GI ([facility] year)

## 2017-05-02 LAB — CYTOLOGY - PAP
DIAGNOSIS: NEGATIVE
HPV (WINDOPATH): NOT DETECTED

## 2017-05-12 DIAGNOSIS — M1711 Unilateral primary osteoarthritis, right knee: Secondary | ICD-10-CM | POA: Diagnosis not present

## 2017-05-12 DIAGNOSIS — M1611 Unilateral primary osteoarthritis, right hip: Secondary | ICD-10-CM | POA: Diagnosis not present

## 2017-10-03 ENCOUNTER — Other Ambulatory Visit: Payer: Self-pay | Admitting: Internal Medicine

## 2017-10-03 DIAGNOSIS — Z1231 Encounter for screening mammogram for malignant neoplasm of breast: Secondary | ICD-10-CM

## 2017-10-26 ENCOUNTER — Ambulatory Visit
Admission: RE | Admit: 2017-10-26 | Discharge: 2017-10-26 | Disposition: A | Discharge status: Home / Self Care | Payer: BLUE CROSS/BLUE SHIELD | Source: Ambulatory Visit | Attending: Internal Medicine | Admitting: Internal Medicine

## 2017-10-26 DIAGNOSIS — Z1231 Encounter for screening mammogram for malignant neoplasm of breast: Secondary | ICD-10-CM
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
## 2018-05-02 ENCOUNTER — Encounter: Payer: Self-pay | Admitting: Adult Health

## 2018-05-02 ENCOUNTER — Other Ambulatory Visit: Payer: Medicare Other | Admitting: Adult Health
